# Patient Record
Sex: Male | Born: 1950 | Race: Black or African American | Hispanic: No | Marital: Married | Smoking: Never smoker
Health system: Southern US, Community
[De-identification: ages and names within clinical notes are randomized; demographics above are authoritative.]

## PROBLEM LIST (undated history)

## (undated) DIAGNOSIS — I10 Essential (primary) hypertension: Secondary | ICD-10-CM

## (undated) HISTORY — DX: Essential (primary) hypertension: I10

---

## 2015-04-06 ENCOUNTER — Encounter (HOSPITAL_COMMUNITY): Payer: Self-pay | Admitting: Emergency Medicine

## 2015-04-06 ENCOUNTER — Emergency Department (HOSPITAL_COMMUNITY)
Admission: EM | Admit: 2015-04-06 | Discharge: 2015-04-07 | Disposition: A | Discharge status: Home / Self Care | Payer: No Typology Code available for payment source | Attending: Emergency Medicine | Admitting: Emergency Medicine

## 2015-04-06 ENCOUNTER — Emergency Department (HOSPITAL_COMMUNITY): Payer: No Typology Code available for payment source

## 2015-04-06 DIAGNOSIS — S161XXA Strain of muscle, fascia and tendon at neck level, initial encounter: Secondary | ICD-10-CM

## 2015-04-06 DIAGNOSIS — Z79899 Other long term (current) drug therapy: Secondary | ICD-10-CM | POA: Diagnosis not present

## 2015-04-06 DIAGNOSIS — Y9389 Activity, other specified: Secondary | ICD-10-CM | POA: Diagnosis not present

## 2015-04-06 DIAGNOSIS — Y92410 Unspecified street and highway as the place of occurrence of the external cause: Secondary | ICD-10-CM | POA: Diagnosis not present

## 2015-04-06 DIAGNOSIS — S199XXA Unspecified injury of neck, initial encounter: Secondary | ICD-10-CM | POA: Diagnosis present

## 2015-04-06 DIAGNOSIS — S39012A Strain of muscle, fascia and tendon of lower back, initial encounter: Secondary | ICD-10-CM | POA: Diagnosis not present

## 2015-04-06 DIAGNOSIS — Y998 Other external cause status: Secondary | ICD-10-CM | POA: Diagnosis not present

## 2015-04-06 MED ORDER — METHOCARBAMOL 500 MG PO TABS
500.0000 mg | ORAL_TABLET | Freq: Once | ORAL | Status: AC
  Administered 2015-04-06: 500 mg via ORAL
  Filled 2015-04-06: qty 1

## 2015-04-06 MED ORDER — TRAMADOL HCL 50 MG PO TABS: 50.0000 mg | ORAL_TABLET | Freq: Four times a day (QID) | ORAL | Status: AC | PRN

## 2015-04-06 MED ORDER — METHOCARBAMOL 500 MG PO TABS: 500.0000 mg | ORAL_TABLET | Freq: Two times a day (BID) | ORAL | Status: AC

## 2015-04-06 MED ORDER — HYDROCODONE-ACETAMINOPHEN 5-325 MG PO TABS
2.0000 | ORAL_TABLET | Freq: Once | ORAL | Status: AC
  Administered 2015-04-06: 2 via ORAL
  Filled 2015-04-06: qty 2

## 2015-04-06 MED ORDER — NAPROXEN 500 MG PO TABS: 500.0000 mg | ORAL_TABLET | Freq: Two times a day (BID) | ORAL | Status: AC
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
## 2015-04-07 NOTE — ED Notes (Signed)
Patient given CT and X-ray images. AVS explained in detail, no other c/c.
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
## 2023-05-09 ENCOUNTER — Emergency Department (HOSPITAL_COMMUNITY)
Admission: EM | Admit: 2023-05-09 | Discharge: 2023-05-09 | Disposition: A | Discharge status: Home / Self Care | Payer: Medicare Other | Attending: Emergency Medicine | Admitting: Emergency Medicine

## 2023-05-09 ENCOUNTER — Other Ambulatory Visit: Payer: Self-pay

## 2023-05-09 ENCOUNTER — Encounter (HOSPITAL_COMMUNITY): Payer: Self-pay

## 2023-05-09 DIAGNOSIS — I10 Essential (primary) hypertension: Secondary | ICD-10-CM | POA: Diagnosis not present

## 2023-05-09 DIAGNOSIS — R338 Other retention of urine: Secondary | ICD-10-CM

## 2023-05-09 DIAGNOSIS — R339 Retention of urine, unspecified: Secondary | ICD-10-CM | POA: Diagnosis not present

## 2023-05-09 DIAGNOSIS — R030 Elevated blood-pressure reading, without diagnosis of hypertension: Secondary | ICD-10-CM

## 2023-05-09 LAB — URINALYSIS, ROUTINE W REFLEX MICROSCOPIC
Bilirubin Urine: NEGATIVE
Glucose, UA: NEGATIVE mg/dL
Hgb urine dipstick: NEGATIVE
Ketones, ur: NEGATIVE mg/dL
Leukocytes,Ua: NEGATIVE
Nitrite: NEGATIVE
Protein, ur: NEGATIVE mg/dL
Specific Gravity, Urine: 1.006 (ref 1.005–1.030)
pH: 6 (ref 5.0–8.0)

## 2023-05-09 NOTE — Discharge Instructions (Signed)
It was our pleasure to provide your ER care today - we hope that you feel better.  Empty leg bag as need.   Follow up with your doctor/urologist in one week for recheck and possible removal of urinary catheter.   Your blood pressure is high today  - follow up with primary care doctor for recheck in one week.  Return to ER if worse, new symptoms, new/severe pain, catheter not working, severe abdominal pain, persistent vomiting, fevers, or other concern.
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
## 2023-05-09 NOTE — ED Provider Notes (Signed)
[place] [facility]MENT AT [facility] Provider Note   CSN: [id number] Arrival date & time: 05/09/23  0750     History  Chief Complaint  Patient presents with   Urinary Retention    Jose Griffin is a 72 y.o. male.  Pt c/o feeling as if unable to empty bladder, urinating small amounts, feeling as if bladder full. Hx enlarged prostate and prior foley for same, last in 10/2022. Denies dysuria or hematuria. No back/flank pain. No scrotal or testicular pain or swelling. No fever or chills. Has been eating/drinking. No nvd.   The history is provided by the patient and medical records.       Home Medications Prior to Admission medications   Medication Sig Start Date End Date Taking? Authorizing Provider  methocarbamol (ROBAXIN) 500 MG tablet Take 1 tablet (500 mg total) by mouth 2 (two) times daily. 04/06/15   Antony Madura, PA-C  Multiple Vitamin (MULTIVITAMIN WITH MINERALS) TABS tablet Take 1 tablet by mouth daily.    [provider]  naproxen (NAPROSYN) 500 MG tablet Take 1 tablet (500 mg total) by mouth 2 (two) times daily. 04/06/15   Antony Madura, PA-C  PRESCRIPTION MEDICATION Take 1 tablet by mouth daily. Blood pressure medication    [provider]  PRESCRIPTION MEDICATION Take 1 capsule by mouth daily. Prostate medication    [provider]  traMADol (ULTRAM) 50 MG tablet Take 1 tablet (50 mg total) by mouth every 6 (six) hours as needed for severe pain. 04/06/15   Antony Madura, PA-C      Allergies    Patient has no known allergies.    Review of Systems   Review of Systems  Constitutional:  Negative for fever.  Eyes:  Negative for redness.  Respiratory:  Negative for shortness of breath.   Cardiovascular:  Negative for chest pain.  Gastrointestinal:  Negative for abdominal pain, diarrhea and vomiting.  Genitourinary:  Negative for dysuria, flank pain and testicular pain.  Musculoskeletal:  Negative for back pain.    Physical  Exam Updated Vital Signs BP (!) 154/94   Pulse 87   Temp 98.7 F (37.1 C) (Oral)   Resp 19   Ht 1.74 m (5' 8.5")   Wt 101.2 kg   SpO2 100%   BMI 33.41 kg/m  Physical Exam Vitals and nursing note reviewed.  Constitutional:      Appearance: Normal appearance. He is well-developed.  HENT:     Head: Atraumatic.     Nose: Nose normal.     Mouth/Throat:     Mouth: Mucous membranes are moist.  Eyes:     General: No scleral icterus.    Conjunctiva/sclera: Conjunctivae normal.  Neck:     Trachea: No tracheal deviation.  Cardiovascular:     Rate and Rhythm: Normal rate.     Pulses: Normal pulses.  Pulmonary:     Effort: Pulmonary effort is normal. No accessory muscle usage or respiratory distress.  Abdominal:     General: Bowel sounds are normal.     Palpations: Abdomen is soft.     Tenderness: There is no guarding.     Comments: Mild suprapubic fullness/tenderness.   Genitourinary:    Comments: No cva tenderness. Normal external gu exam.  Musculoskeletal:        General: No swelling.     Cervical back: Normal range of motion and neck supple. No rigidity.  Skin:    General: Skin is warm and dry.  Findings: No rash.  Neurological:     Mental Status: He is alert.     Comments: Alert, speech clear.   Psychiatric:        Mood and Affect: Mood normal.     ED Results / Procedures / Treatments   Labs (all labs ordered are listed, but only abnormal results are displayed) Results for orders placed or performed during the hospital encounter of 05/09/23  Urinalysis, Routine w reflex microscopic -Urine, Clean Catch  Result Value Ref Range   Color, Urine STRAW (A) YELLOW   APPearance CLEAR CLEAR   Specific Gravity, Urine 1.006 1.005 - 1.030   pH 6.0 5.0 - 8.0   Glucose, UA NEGATIVE NEGATIVE mg/dL   Hgb urine dipstick NEGATIVE NEGATIVE   Bilirubin Urine NEGATIVE NEGATIVE   Ketones, ur NEGATIVE NEGATIVE mg/dL   Protein, ur NEGATIVE NEGATIVE mg/dL   Nitrite NEGATIVE  NEGATIVE   Leukocytes,Ua NEGATIVE NEGATIVE     EKG None  Radiology No results found.  Procedures Procedures    Medications Ordered in ED Medications - No data to display  ED Course/ Medical Decision Making/ A&P                             Medical Decision Making Problems Addressed: Acute urinary retention: acute illness or injury with systemic symptoms Elevated blood pressure reading: acute illness or injury Essential hypertension: chronic illness or injury with exacerbation, progression, or side effects of treatment that poses a threat to life or bodily functions  Amount and/or Complexity of Data Reviewed External Data Reviewed: notes. Labs: ordered. Decision-making details documented in ED Course.  Risk Prescription drug management. Decision regarding hospitalization.   Iv ns. Continuous pulse ox and cardiac monitoring. Labs ordered/sent. Imaging ordered.   Differential diagnosis includes urinary retention, dehydration, uti, etc. Dispo decision including potential need for admission considered - will get labs and imaging and reassess.   Reviewed nursing notes and prior charts for additional history. External reports reviewed.   Cardiac monitor: sinus rhythm, rate 89.  Labs reviewed/interpreted by me - ua negative for uti.   Bladder scan, foley cath.  ~600-700 cc clear yellow urine in bag. Earlier symptoms resolved.  Leg bag.   Pt currently appears stable for d/c. Abd soft nt. No nv.   Rec close pcp f/u.  Return precautions provided.          Final Clinical Impression(s) / ED Diagnoses Final diagnoses:  Acute urinary retention  Elevated blood pressure reading  Essential hypertension    Rx / DC Orders ED Discharge Orders     None         Cathren Laine, MD 05/09/23 1045

## 2023-05-09 NOTE — ED Notes (Signed)
Pt verbalizes understanding of discharge instructions. Opportunity for questions and answers were provided. Pt discharged from the ED.   ?
